# Patient Record
Sex: Female | Born: 1990 | Race: White | Hispanic: No | Marital: Single | Smoking: Never smoker
Health system: Southern US, Community
[De-identification: ages and names within clinical notes are randomized; demographics above are authoritative.]

## PROBLEM LIST (undated history)

## (undated) DIAGNOSIS — N390 Urinary tract infection, site not specified: Secondary | ICD-10-CM

## (undated) DIAGNOSIS — F419 Anxiety disorder, unspecified: Secondary | ICD-10-CM

## (undated) DIAGNOSIS — E669 Obesity, unspecified: Secondary | ICD-10-CM

## (undated) DIAGNOSIS — F32A Depression, unspecified: Secondary | ICD-10-CM

## (undated) DIAGNOSIS — R519 Headache, unspecified: Secondary | ICD-10-CM

## (undated) DIAGNOSIS — B019 Varicella without complication: Secondary | ICD-10-CM

## (undated) DIAGNOSIS — R51 Headache: Secondary | ICD-10-CM

## (undated) DIAGNOSIS — F329 Major depressive disorder, single episode, unspecified: Secondary | ICD-10-CM

## (undated) DIAGNOSIS — Z0000 Encounter for general adult medical examination without abnormal findings: Secondary | ICD-10-CM

## (undated) HISTORY — DX: Obesity, unspecified: E66.9

## (undated) HISTORY — DX: Headache, unspecified: R51.9

## (undated) HISTORY — DX: Headache: R51

## (undated) HISTORY — DX: Varicella without complication: B01.9

## (undated) HISTORY — DX: Depression, unspecified: F32.A

## (undated) HISTORY — DX: Urinary tract infection, site not specified: N39.0

## (undated) HISTORY — DX: Anxiety disorder, unspecified: F41.9

## (undated) HISTORY — DX: Major depressive disorder, single episode, unspecified: F32.9

## (undated) HISTORY — DX: Encounter for general adult medical examination without abnormal findings: Z00.00

---

## 2016-11-13 ENCOUNTER — Telehealth: Payer: Self-pay | Admitting: Family Medicine

## 2016-11-13 NOTE — Telephone Encounter (Signed)
Caller name:Sapphira Axtman Relationship to patient: Can be reached:[phone] Pharmacy:  Reason for call:Requesting to establish care, a friend of your daughters. Please advise

## 2016-11-13 NOTE — Telephone Encounter (Signed)
Yes I will happily accept her as a patient

## 2016-11-13 NOTE — Telephone Encounter (Signed)
Patient scheduled for 01/22/2017 with Dr. Abner GreenspanBlyth, Thank yu

## 2017-01-22 ENCOUNTER — Ambulatory Visit (INDEPENDENT_AMBULATORY_CARE_PROVIDER_SITE_OTHER): Payer: BLUE CROSS/BLUE SHIELD | Admitting: Family Medicine

## 2017-01-22 ENCOUNTER — Encounter: Payer: Self-pay | Admitting: Family Medicine

## 2017-01-22 VITALS — BP 120/70 | HR 71 | Temp 98.3°F | Resp 18 | Ht 64.0 in | Wt 194.4 lb

## 2017-01-22 DIAGNOSIS — F419 Anxiety disorder, unspecified: Secondary | ICD-10-CM | POA: Diagnosis not present

## 2017-01-22 DIAGNOSIS — Z23 Encounter for immunization: Secondary | ICD-10-CM

## 2017-01-22 DIAGNOSIS — F329 Major depressive disorder, single episode, unspecified: Secondary | ICD-10-CM | POA: Diagnosis not present

## 2017-01-22 DIAGNOSIS — F32A Depression, unspecified: Secondary | ICD-10-CM

## 2017-01-22 DIAGNOSIS — E669 Obesity, unspecified: Secondary | ICD-10-CM

## 2017-01-22 DIAGNOSIS — Z0000 Encounter for general adult medical examination without abnormal findings: Secondary | ICD-10-CM | POA: Diagnosis not present

## 2017-01-22 HISTORY — DX: Encounter for general adult medical examination without abnormal findings: Z00.00

## 2017-01-22 HISTORY — DX: Obesity, unspecified: E66.9

## 2017-01-22 HISTORY — DX: Depression, unspecified: F32.A

## 2017-01-22 HISTORY — DX: Anxiety disorder, unspecified: F41.9

## 2017-01-22 NOTE — Assessment & Plan Note (Signed)
Patient encouraged to maintain heart healthy diet, regular exercise, adequate sleep. Consider daily probiotics. Take medications as prescribed. Given flu shot today. Labs ordered

## 2017-01-22 NOTE — Assessment & Plan Note (Signed)
Encouraged DASH diet, decrease po intake and increase exercise as tolerated. Needs 7-8 hours of sleep nightly. Avoid trans fats, eat small, frequent meals every 4-5 hours with lean proteins, complex carbs and healthy fats. Minimize simple carbs, consider bariatric referral or NOOM online

## 2017-01-22 NOTE — Progress Notes (Signed)
Subjective:  I acted as a Education administrator for Dr. Charlett Blake. [place], [place]   Patient ID: Shelby Blake, female    DOB: [birth year], 26 y.o.   MRN: [id number]  Chief Complaint  Patient presents with  . Establish Care    HPI  Patient is in today for a new patient appointment to establish care. She has a past medical history of anxiety and depression and with her current medication she has gained weight and is also struggling with obesity. 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 She is exercising regularly. No recent febrile illness or recent hospitalization. Denies CP/palp/SOB/HA/congestion/fevers/GI or GU c/o. Taking meds as prescribed  Patient Care Team: Mosie Lukes, MD as PCP - General (Family Medicine)   Past Medical History:  Diagnosis Date  . Anxiety and depression 01/22/2017  . Chicken pox   . Depression   . Frequent headaches   . Obesity (BMI 30-39.9) 01/22/2017  . Preventative health care 01/22/2017  . UTI (urinary tract infection)     History reviewed. No pertinent surgical history.  Family History  Problem Relation Age of Onset  . Arthritis Mother   . Depression Mother   . Mental illness Mother   . Hypertension Father   . Alzheimer's disease Maternal Grandmother   . Heart disease Maternal Grandfather   . Arthritis Paternal Grandmother   . Stroke Paternal Grandfather     Social History   Social History  . Marital status: Single    Spouse name: N/A  . Number of children: N/A  . Years of education: N/A   Occupational History  . Not on file.   Social History Main Topics  . Smoking status: Never Smoker  . Smokeless tobacco: Never Used  . Alcohol use Yes  . Drug use: No  . Sexual activity: Yes   Other Topics Concern  . 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   No facility-administered medications prior to visit.     Allergies not on file  Review of Systems  Constitutional: Negative for chills, fever and malaise/fatigue.  HENT: Negative for congestion and hearing loss.   Eyes: Negative for discharge.  Respiratory: Negative for cough, sputum production and shortness of breath.   Cardiovascular: Negative for chest pain, palpitations and leg swelling.  Gastrointestinal: Negative for abdominal pain, blood in stool, constipation, diarrhea, heartburn, nausea and vomiting.  Genitourinary: Negative for dysuria, frequency, hematuria and urgency.  Musculoskeletal: Negative for back pain, falls and myalgias.  Skin: Negative for rash.  Neurological: Negative for dizziness, sensory change, loss of consciousness, weakness and headaches.  Endo/Heme/Allergies: Negative for environmental allergies. Does not bruise/bleed easily.  Psychiatric/Behavioral: Negative for depression and suicidal ideas. The patient is nervous/anxious. The patient does not have insomnia.        Objective:    Physical Exam  Constitutional: She is oriented to person, place, and time. She appears well-developed and well-nourished. No distress.  HENT:  Head: Normocephalic and atraumatic.  Eyes: Conjunctivae are normal.  Neck: Neck supple. No thyromegaly present.  Cardiovascular: Normal rate, regular rhythm and normal heart sounds.   No murmur heard. Pulmonary/Chest: Effort normal and breath sounds normal. No respiratory distress.  Abdominal: Soft. Bowel sounds  are normal. She exhibits no distension and no mass. There is no tenderness.  Musculoskeletal: She exhibits no edema.  Lymphadenopathy:    She has no cervical adenopathy.  Neurological: She is alert and oriented to person, place, and time.  Skin: Skin is warm and dry.  Psychiatric: She has a normal mood and affect. Her behavior is normal.    BP 120/70  (BP Location: Left Arm, Patient Position: Sitting, Cuff Size: Normal)   Pulse 71   Temp 98.3 F (36.8 C) (Oral)   Resp 18   Ht '5\' 4"'$  (1.626 m)   Wt 194 lb 6.4 oz (88.2 kg)   LMP 12/22/2016 (Exact Date) Comment: Nuvaring Birth Control  SpO2 98%   BMI 33.37 kg/m  Wt Readings from Last 3 Encounters:  01/22/17 194 lb 6.4 oz (88.2 kg)   BP Readings from Last 3 Encounters:  01/22/17 120/70     Immunization History  Administered Date(s) Administered  . Influenza,inj,Quad PF,6+ Mos 01/22/2017    Health Maintenance  Topic Date Due  . HIV Screening  08/30/2003  . TETANUS/TDAP  08/30/2007  . PAP SMEAR  08/29/2009  . INFLUENZA VACCINE  11/05/2016    No results found for: WBC, HGB, HCT, PLT, GLUCOSE, CHOL, TRIG, HDL, LDLDIRECT, LDLCALC, ALT, AST, NA, K, CL, CREATININE, BUN, CO2, TSH, PSA, INR, GLUF, HGBA1C, MICROALBUR  No results found for: TSH No results found for: WBC, HGB, HCT, MCV, PLT No results found for: NA, K, CHLORIDE, CO2, GLUCOSE, BUN, CREATININE, BILITOT, ALKPHOS, AST, ALT, PROT, ALBUMIN, CALCIUM, ANIONGAP, EGFR, GFR No results found for: CHOL No results found for: HDL No results found for: LDLCALC No results found for: TRIG No results found for: CHOLHDL No results found for: HGBA1C       Assessment & Plan:   Problem List Items Addressed This Visit    Preventative health care    Patient encouraged to maintain heart healthy diet, regular exercise, adequate sleep. Consider daily probiotics. Take medications as prescribed. Given flu shot today. Labs ordered      Relevant Orders   CBC   Comprehensive metabolic panel   Lipid panel   TSH   Anxiety and depression    Doing well with a counselor in [facility], at the [place], NP prescribes her Sertraline and she sees a counselor [employer].       Relevant Medications   sertraline (ZOLOFT) 50 MG tablet   Obesity (BMI 30-39.9)    Encouraged DASH diet, decrease po intake and  increase exercise as tolerated. Needs 7-8 hours of sleep nightly. Avoid trans fats, eat small, frequent meals every 4-5 hours with lean proteins, complex carbs and healthy fats. Minimize simple carbs, consider bariatric referral or NOOM online       Other Visit Diagnoses    Needs flu shot    -  Primary   Relevant Orders   Flu Vaccine QUAD 6+ mos PF IM (Fluarix Quad PF) (Completed)      I am having Ms. Smith maintain her Omega-3, sertraline, and NUVARING.  Meds ordered this encounter  Medications  . Omega-3 1000 MG CAPS    Sig: Take 1 capsule by mouth daily.  . sertraline (ZOLOFT) 50 MG tablet    Sig: Take 1 tablet by mouth daily.    Refill:  3  . NUVARING 0.12-0.015 MG/24HR vaginal ring    Sig: Place 1 Device vaginally every 21 ( twenty-one) days.    Refill:  4  CMA served as Education administrator during this visit. History, Physical and Plan performed by medical provider. Documentation and orders reviewed and attested to.  Penni Homans, MD

## 2017-01-22 NOTE — Patient Instructions (Addendum)
NOOM online  DASH Eating Plan DASH stands for "Dietary Approaches to Stop Hypertension." The DASH eating plan is a healthy eating plan that has been shown to reduce high blood pressure (hypertension). It may also reduce your risk for type 2 diabetes, heart disease, and stroke. The DASH eating plan may also help with weight loss. What are tips for following this plan? General guidelines  Avoid eating more than 2,300 mg (milligrams) of salt (sodium) a day. If you have hypertension, you may need to reduce your sodium intake to 1,500 mg a day.  Limit alcohol intake to no more than 1 drink a day for nonpregnant women and 2 drinks a day for men. One drink equals 12 oz of beer, 5 oz of wine, or 1 oz of hard liquor.  Work with your health care provider to maintain a healthy body weight or to lose weight. Ask what an ideal weight is for you.  Get at least 30 minutes of exercise that causes your heart to beat faster (aerobic exercise) most days of the week. Activities may include walking, swimming, or biking.  Work with your health care provider or diet and nutrition specialist (dietitian) to adjust your eating plan to your individual calorie needs. Reading food labels  Check food labels for the amount of sodium per serving. Choose foods with less than 5 percent of the Daily Value of sodium. Generally, foods with less than 300 mg of sodium per serving fit into this eating plan.  To find whole grains, look for the word "whole" as the first word in the ingredient list. Shopping  Buy products labeled as "low-sodium" or "no salt added."  Buy fresh foods. Avoid canned foods and premade or frozen meals. Cooking  Avoid adding salt when cooking. Use salt-free seasonings or herbs instead of table salt or sea salt. Check with your health care provider or pharmacist before using salt substitutes.  Do not fry foods. Cook foods using healthy methods such as baking, boiling, grilling, and broiling  instead.  Cook with heart-healthy oils, such as olive, canola, soybean, or sunflower oil. Meal planning   Eat a balanced diet that includes: ? 5 or more servings of fruits and vegetables each day. At each meal, try to fill half of your plate with fruits and vegetables. ? Up to 6-8 servings of whole grains each day. ? Less than 6 oz of lean meat, poultry, or fish each day. A 3-oz serving of meat is about the same size as a deck of cards. One egg equals 1 oz. ? 2 servings of low-fat dairy each day. ? A serving of nuts, seeds, or beans 5 times each week. ? Heart-healthy fats. Healthy fats called Omega-3 fatty acids are found in foods such as flaxseeds and coldwater fish, like sardines, salmon, and mackerel.  Limit how much you eat of the following: ? Canned or prepackaged foods. ? Food that is high in trans fat, such as fried foods. ? Food that is high in saturated fat, such as fatty meat. ? Sweets, desserts, sugary drinks, and other foods with added sugar. ? Full-fat dairy products.  Do not salt foods before eating.  Try to eat at least 2 vegetarian meals each week.  Eat more home-cooked food and less restaurant, buffet, and fast food.  When eating at a restaurant, ask that your food be prepared with less salt or no salt, if possible. What foods are recommended? The items listed may not be a complete list. Talk with your  dietitian about what dietary choices are best for you. Grains Whole-grain or whole-wheat bread. Whole-grain or whole-wheat pasta. Brown rice. [place]ena Morrow. Bulgur. Whole-grain and low-sodium cereals. Pita bread. Low-fat, low-sodium crackers. Whole-wheat flour tortillas. Vegetables Fresh or frozen vegetables (raw, steamed, roasted, or grilled). Low-sodium or reduced-sodium tomato and vegetable juice. Low-sodium or reduced-sodium tomato sauce and tomato paste. Low-sodium or reduced-sodium canned vegetables. Fruits All fresh, dried, or frozen fruit. Canned fruit in  natural juice (without added sugar). Meat and other protein foods Skinless chicken or [place]. Ground chicken or [place]. Pork with fat trimmed off. Fish and seafood. Egg whites. Dried beans, peas, or lentils. Unsalted nuts, nut butters, and seeds. Unsalted canned beans. Lean cuts of beef with fat trimmed off. Low-sodium, lean deli meat. Dairy Low-fat (1%) or fat-free (skim) milk. Fat-free, low-fat, or reduced-fat cheeses. Nonfat, low-sodium ricotta or cottage cheese. Low-fat or nonfat yogurt. Low-fat, low-sodium cheese. Fats and oils Soft margarine without trans fats. Vegetable oil. Low-fat, reduced-fat, or light mayonnaise and salad dressings (reduced-sodium). Canola, safflower, olive, soybean, and sunflower oils. Avocado. Seasoning and other foods Herbs. Spices. Seasoning mixes without salt. Unsalted popcorn and pretzels. Fat-free sweets. What foods are not recommended? The items listed may not be a complete list. Talk with your dietitian about what dietary choices are best for you. Grains Baked goods made with fat, such as croissants, muffins, or some breads. Dry pasta or rice meal packs. Vegetables Creamed or fried vegetables. Vegetables in a cheese sauce. Regular canned vegetables (not low-sodium or reduced-sodium). Regular canned tomato sauce and paste (not low-sodium or reduced-sodium). Regular tomato and vegetable juice (not low-sodium or reduced-sodium). Angie Fava. Olives. Fruits Canned fruit in a light or heavy syrup. Fried fruit. Fruit in cream or butter sauce. Meat and other protein foods Fatty cuts of meat. Ribs. Fried meat. Berniece Salines. Sausage. Bologna and other processed lunch meats. Salami. Fatback. Hotdogs. Bratwurst. Salted nuts and seeds. Canned beans with added salt. Canned or smoked fish. Whole eggs or egg yolks. Chicken or [place] with skin. Dairy Whole or 2% milk, cream, and half-and-half. Whole or full-fat cream cheese. Whole-fat or sweetened yogurt. Full-fat cheese. Nondairy  creamers. Whipped toppings. Processed cheese and cheese spreads. Fats and oils Butter. Stick margarine. Lard. Shortening. Ghee. Bacon fat. Tropical oils, such as coconut, palm kernel, or palm oil. Seasoning and other foods Salted popcorn and pretzels. Onion salt, garlic salt, seasoned salt, table salt, and sea salt. Worcestershire sauce. Tartar sauce. Barbecue sauce. Teriyaki sauce. Soy sauce, including reduced-sodium. Steak sauce. Canned and packaged gravies. Fish sauce. Oyster sauce. Cocktail sauce. Horseradish that you find on the shelf. Ketchup. Mustard. Meat flavorings and tenderizers. Bouillon cubes. Hot sauce and Tabasco sauce. Premade or packaged marinades. Premade or packaged taco seasonings. Relishes. Regular salad dressings. Where to find more information:  National Heart, Lung, and [place]: https://wilson-eaton.com/  American Heart Association: www.heart.org Summary  The DASH eating plan is a healthy eating plan that has been shown to reduce high blood pressure (hypertension). It may also reduce your risk for type 2 diabetes, heart disease, and stroke.  With the DASH eating plan, you should limit salt (sodium) intake to 2,300 mg a day. If you have hypertension, you may need to reduce your sodium intake to 1,500 mg a day.  When on the DASH eating plan, aim to eat more fresh fruits and vegetables, whole grains, lean proteins, low-fat dairy, and heart-healthy fats.  Work with your health care provider or diet and nutrition specialist (dietitian) to adjust your eating plan  to your individual calorie needs. This information is not intended to replace advice given to you by your health care provider. Make sure you discuss any questions you have with your health care provider. Document Released: 03/13/2011 Document Revised: 03/17/2016 Document Reviewed: 03/17/2016 Elsevier Interactive Patient Education  2017 [place] 18-39 Years, Female Preventive care refers to  lifestyle choices and visits with your health care provider that can promote health and wellness. What does preventive care include? A yearly physical exam. This is also called an annual well check. Dental exams once or twice a year. Routine eye exams. Ask your health care provider how often you should have your eyes checked. Personal lifestyle choices, including: Daily care of your teeth and gums. Regular physical activity. Eating a healthy diet. Avoiding tobacco and drug use. Limiting alcohol use. Practicing safe sex. Taking vitamin and mineral supplements as recommended by your health care provider. What happens during an annual well check? The services and screenings done by your health care provider during your annual well check will depend on your age, overall health, lifestyle risk factors, and family history of disease. Counseling Your health care provider may ask you questions about your: Alcohol use. Tobacco use. Drug use. Emotional well-being. Home and relationship well-being. Sexual activity. Eating habits. Work and work Statistician. Method of birth control. Menstrual cycle. Pregnancy history.  Screening You may have the following tests or measurements: Height, weight, and BMI. Diabetes screening. This is done by checking your blood sugar (glucose) after you have not eaten for a while (fasting). Blood pressure. Lipid and cholesterol levels. These may be checked every 5 years starting at age 72. Skin check. Hepatitis C blood test. Hepatitis B blood test. Sexually transmitted disease (STD) testing. BRCA-related cancer screening. This may be done if you have a family history of breast, ovarian, tubal, or peritoneal cancers. Pelvic exam and Pap test. This may be done every 3 years starting at age 40. Starting at age 50, this may be done every 5 years if you have a Pap test in combination with an HPV test.  Discuss your test results, treatment options, and if  necessary, the need for more tests with your health care provider. Vaccines Your health care provider may recommend certain vaccines, such as: Influenza vaccine. This is recommended every year. Tetanus, diphtheria, and acellular pertussis (Tdap, Td) vaccine. You may need a Td booster every 10 years. Varicella vaccine. You may need this if you have not been vaccinated. HPV vaccine. If you are 20 or younger, you may need three doses over 6 months. Measles, mumps, and rubella (MMR) vaccine. You may need at least one dose of MMR. You may also need a second dose. Pneumococcal 13-valent conjugate (PCV13) vaccine. You may need this if you have certain conditions and were not previously vaccinated. Pneumococcal polysaccharide (PPSV23) vaccine. You may need one or two doses if you smoke cigarettes or if you have certain conditions. Meningococcal vaccine. One dose is recommended if you are age 57-21 years and a first-year college student living in a residence hall, or if you have one of several medical conditions. You may also need additional booster doses. Hepatitis A vaccine. You may need this if you have certain conditions or if you travel or work in places where you may be exposed to hepatitis A. Hepatitis B vaccine. You may need this if you have certain conditions or if you travel or work in places where you may be exposed to hepatitis B. Haemophilus  influenzae type b (Hib) vaccine. You may need this if you have certain risk factors.  Talk to your health care provider about which screenings and vaccines you need and how often you need them. This information is not intended to replace advice given to you by your health care provider. Make sure you discuss any questions you have with your health care provider. Document Released: 05/20/2001 Document Revised: 12/12/2015 Document Reviewed: 01/23/2015 Elsevier Interactive Patient Education  2017 [employer].

## 2017-01-22 NOTE — Assessment & Plan Note (Signed)
Do[place] well [place] in [place], at the [facility], Drucie IpBrian MacCarthy, NP prescribes her Sertraline and she sees a counselor [facility] ResourcesKate Totten.

## 2017-01-23 LAB — LIPID PANEL
CHOL/HDL RATIO: 3
CHOLESTEROL: 190 mg/dL (ref 0–200)
HDL: 70 mg/dL (ref >39.00)
LDL CALC: 96 mg/dL (ref 0–99)
NonHDL: 120.14
Triglycerides: 120 mg/dL (ref 0.0–149.0)
VLDL: 24 mg/dL (ref 0.0–40.0)

## 2017-01-23 LAB — COMPREHENSIVE METABOLIC PANEL
ALBUMIN: 4.2 g/dL (ref 3.5–5.2)
ALT: 17 U/L (ref 0–35)
AST: 21 U/L (ref 0–37)
Alkaline Phosphatase: 31 U/L — ABNORMAL LOW (ref 39–117)
BUN: 14 mg/dL (ref 6–23)
CO2: 24 meq/L (ref 19–32)
Calcium: 9.5 mg/dL (ref 8.4–10.5)
Chloride: 104 mEq/L (ref 96–112)
Creatinine, Ser: 0.72 mg/dL (ref 0.40–1.20)
GFR: 102.22 mL/min (ref >60.00)
Glucose, Bld: 80 mg/dL (ref 70–99)
POTASSIUM: 4.5 meq/L (ref 3.5–5.1)
SODIUM: 137 meq/L (ref 135–145)
Total Bilirubin: 0.4 mg/dL (ref 0.2–1.2)
Total Protein: 7.1 g/dL (ref 6.0–8.3)

## 2017-01-23 LAB — CBC
HCT: 38.1 % (ref 36.0–46.0)
HEMOGLOBIN: 12.4 g/dL (ref 12.0–15.0)
MCHC: 32.4 g/dL (ref 30.0–36.0)
MCV: 89.5 fl (ref 78.0–100.0)
Platelets: 335 10*3/uL (ref 150.0–400.0)
RBC: 4.26 Mil/uL (ref 3.87–5.11)
RDW: 13.5 % (ref 11.5–15.5)
WBC: 5.7 10*3/uL (ref 4.0–10.5)

## 2017-01-23 LAB — TSH: TSH: 1.23 u[IU]/mL (ref 0.35–4.50)

## 2017-02-15 ENCOUNTER — Encounter: Payer: Self-pay | Admitting: Family Medicine

## 2017-03-04 ENCOUNTER — Encounter: Payer: Self-pay | Admitting: Family Medicine

## 2017-03-05 ENCOUNTER — Other Ambulatory Visit: Payer: Self-pay | Admitting: Family Medicine

## 2017-03-05 DIAGNOSIS — R0683 Snoring: Secondary | ICD-10-CM

## 2017-04-10 ENCOUNTER — Institutional Professional Consult (permissible substitution): Payer: BLUE CROSS/BLUE SHIELD | Admitting: Pulmonary Disease

## 2017-04-30 ENCOUNTER — Ambulatory Visit: Payer: BLUE CROSS/BLUE SHIELD | Admitting: Family Medicine

## 2017-05-06 ENCOUNTER — Institutional Professional Consult (permissible substitution): Payer: BLUE CROSS/BLUE SHIELD | Admitting: Pulmonary Disease

## 2017-05-12 ENCOUNTER — Ambulatory Visit: Payer: Self-pay | Admitting: Family Medicine

## 2017-06-01 ENCOUNTER — Ambulatory Visit: Payer: Self-pay | Admitting: Family Medicine

## 2017-06-03 ENCOUNTER — Institutional Professional Consult (permissible substitution): Payer: BLUE CROSS/BLUE SHIELD | Admitting: Pulmonary Disease

## 2017-06-26 ENCOUNTER — Ambulatory Visit (INDEPENDENT_AMBULATORY_CARE_PROVIDER_SITE_OTHER): Payer: BLUE CROSS/BLUE SHIELD | Admitting: Pulmonary Disease

## 2017-06-26 ENCOUNTER — Encounter: Payer: Self-pay | Admitting: Pulmonary Disease

## 2017-06-26 ENCOUNTER — Other Ambulatory Visit (INDEPENDENT_AMBULATORY_CARE_PROVIDER_SITE_OTHER): Payer: BLUE CROSS/BLUE SHIELD

## 2017-06-26 VITALS — BP 128/84 | HR 78 | Ht 64.0 in | Wt 196.6 lb

## 2017-06-26 DIAGNOSIS — R062 Wheezing: Secondary | ICD-10-CM

## 2017-06-26 DIAGNOSIS — G4733 Obstructive sleep apnea (adult) (pediatric): Secondary | ICD-10-CM

## 2017-06-26 DIAGNOSIS — Z889 Allergy status to unspecified drugs, medicaments and biological substances status: Secondary | ICD-10-CM | POA: Diagnosis not present

## 2017-06-26 DIAGNOSIS — R061 Stridor: Secondary | ICD-10-CM | POA: Diagnosis not present

## 2017-06-26 LAB — CBC WITH DIFFERENTIAL/PLATELET
BASOS ABS: 0 10*3/uL (ref 0.0–0.1)
Basophils Relative: 0.4 % (ref 0.0–3.0)
EOS PCT: 0.6 % (ref 0.0–5.0)
Eosinophils Absolute: 0 10*3/uL (ref 0.0–0.7)
HEMATOCRIT: 37.7 % (ref 36.0–46.0)
HEMOGLOBIN: 12.9 g/dL (ref 12.0–15.0)
LYMPHS PCT: 36.6 % (ref 12.0–46.0)
Lymphs Abs: 2.4 10*3/uL (ref 0.7–4.0)
MCHC: 34.1 g/dL (ref 30.0–36.0)
MCV: 86.5 fl (ref 78.0–100.0)
MONOS PCT: 6.9 % (ref 3.0–12.0)
Monocytes Absolute: 0.5 10*3/uL (ref 0.1–1.0)
Neutro Abs: 3.7 10*3/uL (ref 1.4–7.7)
Neutrophils Relative %: 55.5 % (ref 43.0–77.0)
Platelets: 334 10*3/uL (ref 150.0–400.0)
RBC: 4.36 Mil/uL (ref 3.87–5.11)
RDW: 13.6 % (ref 11.5–15.5)
WBC: 6.7 10*3/uL (ref 4.0–10.5)

## 2017-06-26 MED ORDER — ALBUTEROL SULFATE HFA 108 (90 BASE) MCG/ACT IN AERS
2.0000 | INHALATION_SPRAY | Freq: Four times a day (QID) | RESPIRATORY_TRACT | 6 refills | Status: AC | PRN
Start: 2017-06-26 — End: ?

## 2017-06-26 NOTE — Patient Instructions (Addendum)
Lung function appears normal CT neck will be scheduled  Blood work today for allergies  Trial of albuterol MDI 2 puffs every 6 hours as needed for wheezing  Schedule home sleep study

## 2017-06-26 NOTE — Assessment & Plan Note (Signed)
Given  narrow pharyngeal exam& loud snoring, obstructive sleep apnea is very likely & an overnight polysomnogram will be scheduled as a home study. The pathophysiology of obstructive sleep apnea , it's cardiovascular consequences & modes of treatment including CPAP were discused with the patient in detail & they evidenced understanding.  Large tonsils noted. Pretest probability is intermediate

## 2017-06-26 NOTE — Assessment & Plan Note (Signed)
Her wheezing seems to be more from inspiratory upper airway sound-not to the point of stridor  Of concern is flattening of the expiratory loop suggesting intrathoracic airway obstruction, unfortunately expiratory loop was not performed We will proceed with CT of the neck to investigate this although she has never had surgeries or endotracheal intubation in the past.  History is not classic for vocal cord dysfunction but this is  a possibility  Check RAST panel  Trial of albuterol MDI 2 puffs every 6 hours as needed for wheezing

## 2017-06-26 NOTE — Progress Notes (Signed)
Subjective:    Patient ID: Shelby Blake, female    DOB: 31-Aug-1990, 27 y.o.   MRN: [id number]  HPI  Chief Complaint  Patient presents with  . Sleep Consult    Referred by Dr. Abner GreenspanBlyth for possible OSA. Per patient, she has been told that she snores at night. Restless sleep. Wheezes throughout the day. Does have occasional SOB.     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  She has always been a restless sleeper and for the last 6 months she reports multiple nocturnal awakenings and non-refreshing sleep.  Moderate snoring has been noted by family members.  [place] on [place] and was told that she did not need treatment for obstructive sleep apnea. Epworth sleepiness score is 5 and she denies excessive somnolence however she does report that she can nap sometimes for about 2 hours if not interrupted. Bedtime is between 11 PM to 12:30 AM, sleep latency can be up to 30-60 minutes but this is always been the case with her since she was a teenager, reports 2-3 nocturnal awakenings including one bathroom visit and is out of bed latest by 8 AM feeling tired with occasional headaches and denies dryness dryness of mouth. She has gained about 20 pounds over the last year that she attributes to Zoloft. There is no history suggestive of cataplexy, sleep paralysis or parasomnias   She also reports wheezing especially in the mornings which seems to get better by evening, this has been ongoing for the last 4 months, this is worsened by exertion, she has a soreness in her throat and sometimes consciously feels the need to cough.  Cough is not productive of any sputum. She lives in the basement apartment of her house and wonders if she has allergies however she has not noticed any obvious exposures. She does not smoke and was a social drinker.  She has never needed prednisone or over-the-counter medications for this.  Significant  tests/ events reviewed    Spirometry shows normal lung function with a ratio 81, FEV1 of 91% FVC of 95%.  There was flattening of the expiratory loop, inspiratory loop was not performed    Past Medical History:  Diagnosis Date  . Anxiety and depression 01/22/2017  . Chicken pox   . Depression   . Frequent headaches   . Obesity (BMI 30-39.9) 01/22/2017  . Preventative health care 01/22/2017  . UTI (urinary tract infection)     No past surgical history on file.  No Known Allergies  Social History   Socioeconomic History  . Marital status: Single    Spouse name: Not on file  . Number of children: Not on file  . Years of education: Not on file  . Highest education level: Not on file  Occupational History  . Not on file  Social Needs  . Financial resource strain: Not on file  . Food insecurity:    Worry: Not on file    Inability: Not on file  . Transportation needs:    Medical: Not on file    Non-medical: Not on file  Tobacco Use  . Smoking status: Never Smoker  . Smokeless tobacco: Never Used  Substance and Sexual Activity  . Alcohol use: Yes  . Drug use: No  . Sexual activity: Yes  Lifestyle  . Physical activity:    Days per week: Not on file    Minutes per session: Not on file  .  Stress: Not on file  Relationships  . Social connections:    Talks on phone: Not on file    Gets together: Not on file    Attends religious service: Not on file    Active member of club or organization: Not on file    Attends meetings of clubs or organizations: Not on file    Relationship status: Not on file  . Intimate partner violence:    Fear of current or ex partner: Not on file    Emotionally abused: Not on file    Physically abused: Not on file    Forced sexual activity: Not on file  Other Topics Concern  . 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 Arthritis Mother   . Depression Mother   . Mental illness Mother   . Hypertension Father   . Alzheimer's disease Maternal Grandmother   . Heart disease Maternal Grandfather   . Arthritis Paternal Grandmother   . Stroke Paternal Grandfather      Review of Systems  Positive for shortness of breath with activity, nonproductive cough, weight gain of over 20 pounds, headaches, anxiety and depression.   Constitutional: negative for anorexia, fevers and sweats  Eyes: negative for irritation, redness and visual disturbance  Ears, nose, mouth, throat, and face: negative for earaches, epistaxis, nasal congestion and sore throat  Respiratory: negative for cough,  sputum and wheezing  Cardiovascular: negative for chest pain, lower extremity edema, orthopnea, palpitations and syncope  Gastrointestinal: negative for abdominal pain, constipation, diarrhea, melena, nausea and vomiting  Genitourinary:negative for dysuria, frequency and hematuria  Hematologic/lymphatic: negative for bleeding, easy bruising and lymphadenopathy  Musculoskeletal:negative for arthralgias, muscle weakness and stiff joints  Neurological: negative for coordination problems, gait problems, headaches and weakness  Endocrine: negative for diabetic symptoms including polydipsia, polyuria and weight loss     Objective:   Physical Exam  Gen. Pleasant, well-nourished, in no distress, normal affect ENT - large tonsils, no post nasal drip Neck: No JVD, no thyromegaly, no carotid bruits Lungs: no use of accessory muscles, no dullness to percussion, clear without rales or rhonchi  Cardiovascular: Rhythm regular, heart sounds  normal, no murmurs or gallops, no peripheral edema Abdomen: soft and non-tender, no hepatosplenomegaly, BS normal. Musculoskeletal: No deformities, no cyanosis or clubbing Neuro:  alert, non focal       Assessment & Plan:

## 2017-06-29 LAB — RESPIRATORY ALLERGY PROFILE REGION II ~~LOC~~
ALLERGEN, D PTERNOYSSINUS, D1: 0.36 kU/L — AB
Allergen, Cedar tree, t12: <0.1 kU/L
Allergen, Cottonwood, t14: <0.1 kU/L
Allergen, Mouse Urine Protein, e78: <0.1 kU/L
Allergen, Mulberry, t76: <0.1 kU/L
Allergen, Oak,t7: <0.1 kU/L
Aspergillus fumigatus, m3: <0.1 kU/L
Bermuda Grass: <0.1 kU/L
CLADOSPORIUM HERBARUM (M2) IGE: <0.1 kU/L
CLASS: 0
CLASS: 0
CLASS: 0
CLASS: 0
CLASS: 0
CLASS: 0
CLASS: 0
CLASS: 0
CLASS: 0
CLASS: 1
Class: 0
Class: 0
Class: 0
Class: 0
Class: 0
Class: 0
Class: 0
Class: 0
Class: 0
Class: 0
Class: 0
Class: 0
Class: 0
Class: 1
Cockroach: 0.33 kU/L — ABNORMAL HIGH
D. FARINAE: 0.36 kU/L — AB
Dog Dander: <0.1 kU/L
IgE (Immunoglobulin E), Serum: 16 kU/L (ref <=114)
Rough Pigweed  IgE: <0.1 kU/L
Sheep Sorrel IgE: <0.1 kU/L
Timothy Grass: <0.1 kU/L

## 2017-06-29 LAB — INTERPRETATION:

## 2017-07-01 ENCOUNTER — Ambulatory Visit (INDEPENDENT_AMBULATORY_CARE_PROVIDER_SITE_OTHER)
Admission: RE | Admit: 2017-07-01 | Discharge: 2017-07-01 | Disposition: A | Discharge status: Home / Self Care | Payer: BLUE CROSS/BLUE SHIELD | Source: Ambulatory Visit | Attending: Pulmonary Disease | Admitting: Pulmonary Disease

## 2017-07-01 DIAGNOSIS — R061 Stridor: Secondary | ICD-10-CM | POA: Diagnosis not present

## 2017-07-02 ENCOUNTER — Telehealth: Payer: Self-pay | Admitting: Pulmonary Disease

## 2017-07-02 NOTE — Telephone Encounter (Signed)
Please call after 4:00pm  ° °

## 2017-07-02 NOTE — Telephone Encounter (Signed)
Called patient unable to reach left message to give us a call back.

## 2017-07-03 NOTE — Telephone Encounter (Signed)
Will route this message to myself so that I can call her back after 4pm.

## 2017-07-06 NOTE — Telephone Encounter (Signed)
Notes recorded by Oretha MilchAlva, Rakesh V, MD on 06/30/2017 at 5:25 PM EDT Low grade to dust  Called patient to relay result note and left message for patient to call back.

## 2017-07-06 NOTE — Telephone Encounter (Signed)
Pt is calling back [phone]

## 2017-07-06 NOTE — Telephone Encounter (Signed)
lmtcb x1 for pt. 

## 2017-07-07 NOTE — Telephone Encounter (Signed)
Pt is aware of below results and voiced her understanding.  Nothing further is needed. 

## 2017-07-13 ENCOUNTER — Ambulatory Visit: Payer: BLUE CROSS/BLUE SHIELD | Admitting: Family Medicine

## 2017-07-14 ENCOUNTER — Encounter: Payer: Self-pay | Admitting: Pulmonary Disease

## 2017-08-06 DIAGNOSIS — G4710 Hypersomnia, unspecified: Secondary | ICD-10-CM | POA: Diagnosis not present

## 2017-08-10 ENCOUNTER — Telehealth: Payer: Self-pay | Admitting: Pulmonary Disease

## 2017-08-10 DIAGNOSIS — G4710 Hypersomnia, unspecified: Secondary | ICD-10-CM | POA: Diagnosis not present

## 2017-08-10 NOTE — Telephone Encounter (Signed)
Per RA, HST did not show OSA. If snoring persists, suggests ENT referral for large tonsils.

## 2017-08-11 ENCOUNTER — Other Ambulatory Visit: Payer: Self-pay | Admitting: *Deleted

## 2017-08-11 DIAGNOSIS — G4733 Obstructive sleep apnea (adult) (pediatric): Secondary | ICD-10-CM

## 2017-08-12 NOTE — Telephone Encounter (Signed)
Left detailed message about Dr Reginia Naas recommendations and if she had any questions, to please call us back.

## 2017-08-26 ENCOUNTER — Encounter: Payer: Self-pay | Admitting: Pulmonary Disease

## 2017-08-26 ENCOUNTER — Ambulatory Visit: Payer: BLUE CROSS/BLUE SHIELD | Admitting: Pulmonary Disease

## 2017-08-26 VITALS — BP 112/78 | HR 81 | Ht 64.0 in | Wt 198.0 lb

## 2017-08-26 DIAGNOSIS — J351 Hypertrophy of tonsils: Secondary | ICD-10-CM | POA: Diagnosis not present

## 2017-08-26 DIAGNOSIS — R062 Wheezing: Secondary | ICD-10-CM | POA: Diagnosis not present

## 2017-08-26 MED ORDER — PREDNISONE 10 MG PO TABS: ORAL_TABLET | ORAL | 0 refills | Status: AC

## 2017-08-26 NOTE — Addendum Note (Signed)
Addended by: Sylvester Harder on: 08/26/2017 10:31 AM   Modules accepted: Orders

## 2017-08-26 NOTE — Assessment & Plan Note (Addendum)
Only abnormality seems to be large tonsils and adenoids.  She has not had recurrent episodes of sore throat.  She does have audible inspiratory and expiratory upper airway pseudo-wheezing. We will refer her to ENT for evaluation If this seems to be delayed, will trial low-dose prednisone Prednisone 10 mg tabs  Take 2 tabs daily with food x 5ds, then 1 tab daily with food x 5ds then STOP  Discussed side effects, she will report back if this helps

## 2017-08-26 NOTE — Progress Notes (Signed)
   Subjective:    Patient ID: Kaydynce Pat, female    DOB: [birth year], 27 y.o.   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  We reviewed all tests. Albuterol has not helped much.    Significant tests/ events reviewed    Spirometry 06/2017  normal lung function with a ratio 81, FEV1 of 91% FVC of 95%.  There was flattening of the expiratory loop, inspiratory loop was not performed  HST AHI 3.6/h CT neck - large tonsils/ adenoids, neck nodes slight enlarged  RAST neg IgE 16  Review of Systems Patient denies significant dyspnea,cough, hemoptysis,  chest pain, palpitations, pedal edema, orthopnea, paroxysmal nocturnal dyspnea, lightheadedness, nausea, vomiting, abdominal or  leg pains      Objective:   Physical Exam  Gen. Pleasant, well-nourished, in no distress ENT - no thrush, no post nasal drip, large tonsils Neck: No JVD, no thyromegaly, no carotid bruits Lungs: no use of accessory muscles, no dullness to percussion, clear without rales or rhonchi  Cardiovascular: Rhythm regular, heart sounds  normal, no murmurs or gallops, no peripheral edema Musculoskeletal: No deformities, no cyanosis or clubbing         Assessment & Plan:

## 2017-08-26 NOTE — Patient Instructions (Signed)
ENT appt Use albuterol as needed for shortness of breath

## 2019-08-12 IMAGING — CT CT NECK W/O CM
4 of 5 series · 15 of 33 positions shown, 17 images · non-contrast
Comparison: None.

CLINICAL DATA: Wheezing and cough.

EXAM:
CT NECK WITHOUT CONTRAST
TECHNIQUE: Multidetector CT imaging of the neck was performed following the
standard protocol without intravenous contrast.

[Series 3: neck wo 2.0 i31s 2 · axial · 0.49mm/px · z∈[+1098,+1224]mm · 3 of 127 slices shown]
[im 32/127  bone]
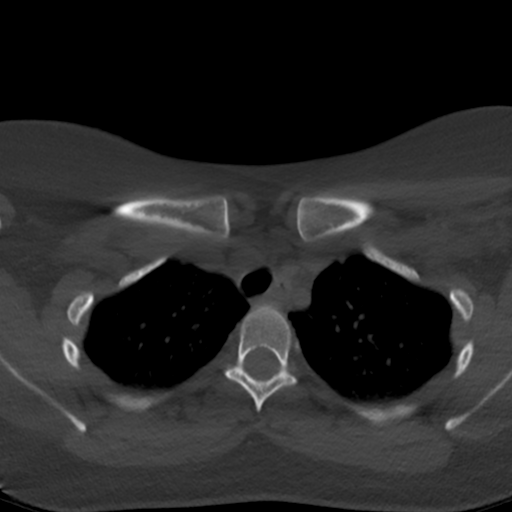
[im 64/127  bone]
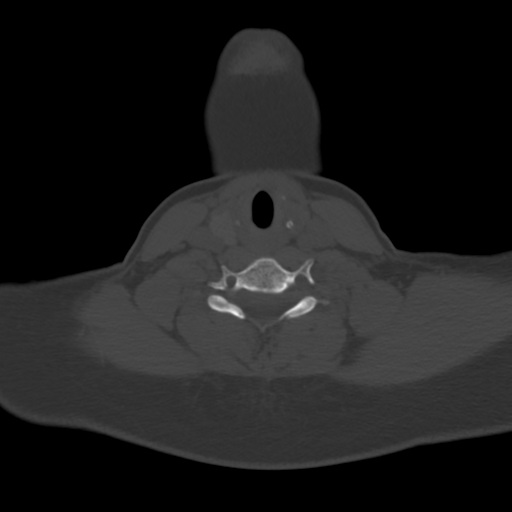
[im 95/127  bone]
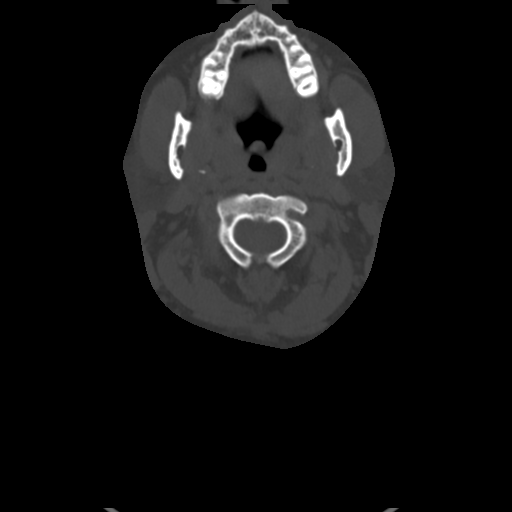

[Series 7: coronal st · coronal · 0.50mm/px · 3 of 112 slices shown]
[im 23/112  bone]
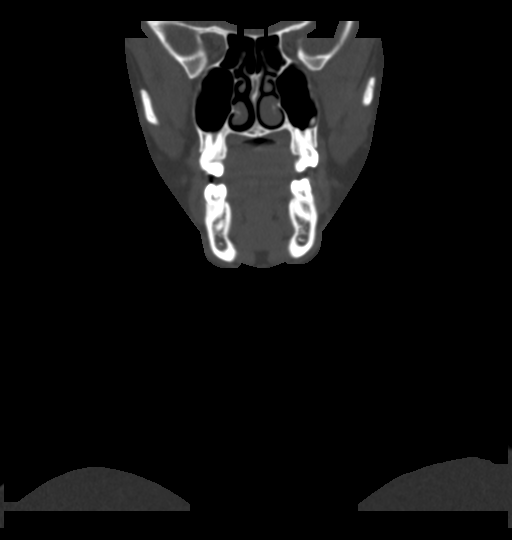
[im 45/112  bone]
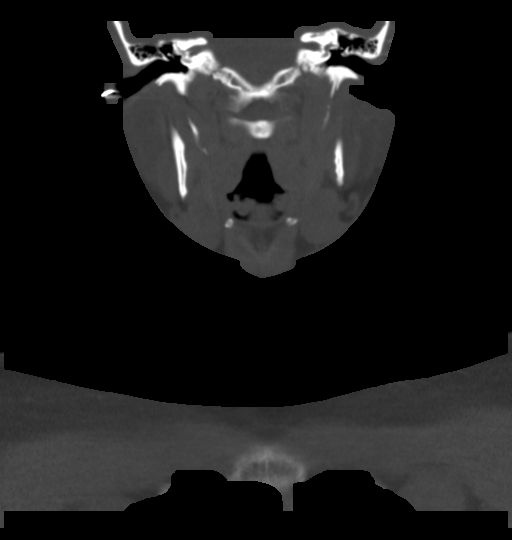
[im 67/112  bone]
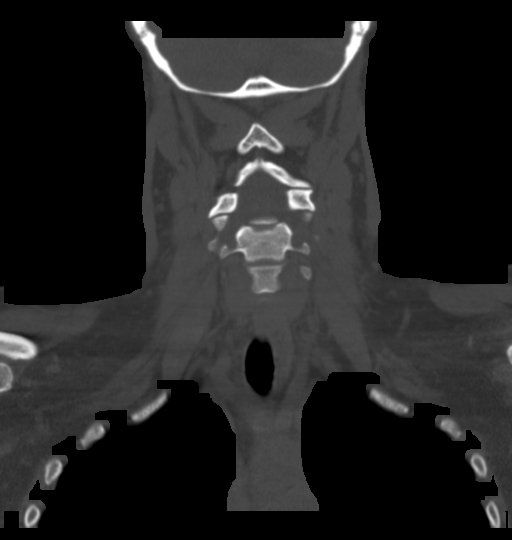

[Series 8: sagittal st · sagittal · 0.50mm/px · 5 of 153 slices shown, 6 images]
[im 51/153  bone]
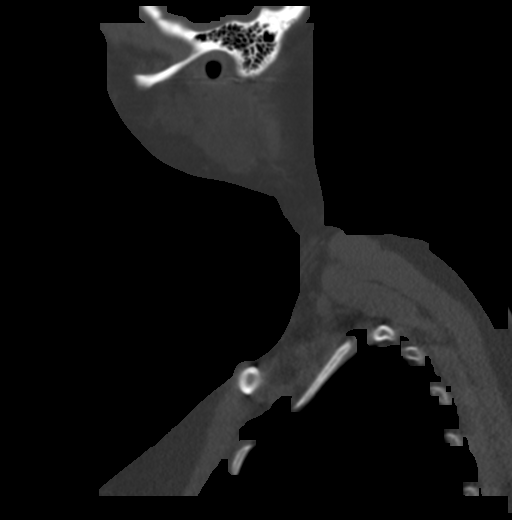
[im 64/153  bone]
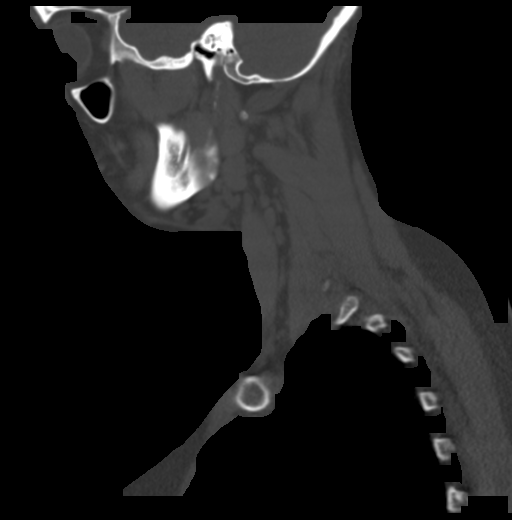
[im 77/153  soft-tissue]
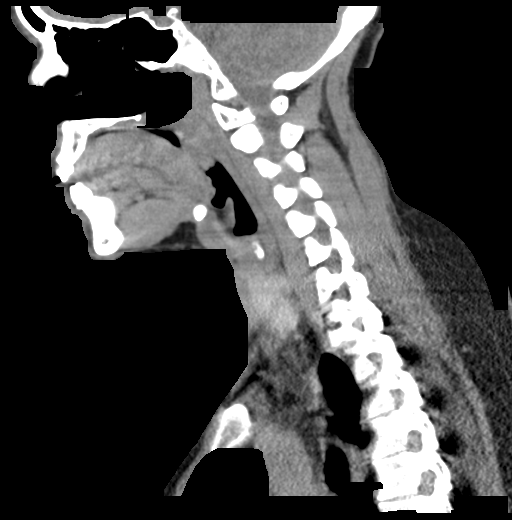
[im 77/153  bone]
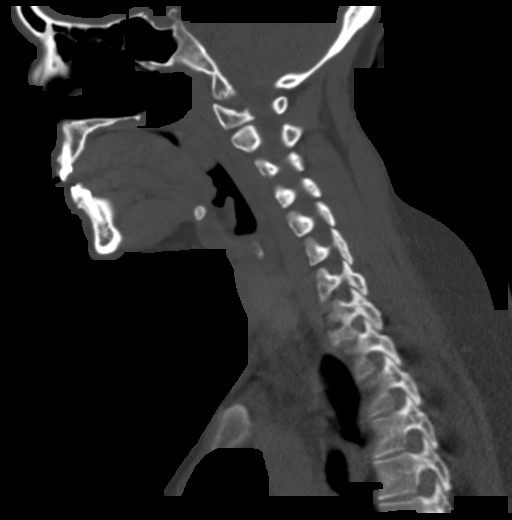
[im 89/153  bone]
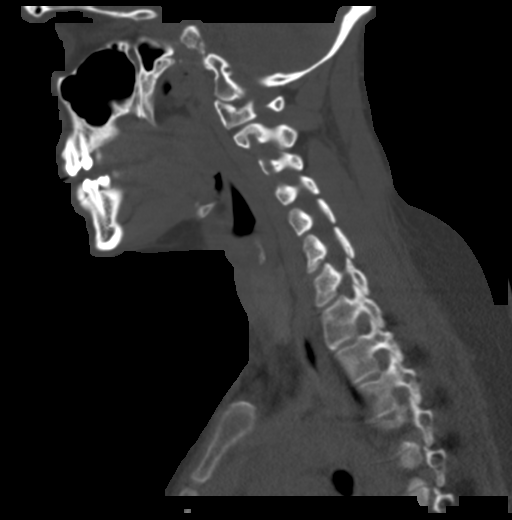
[im 102/153  bone]
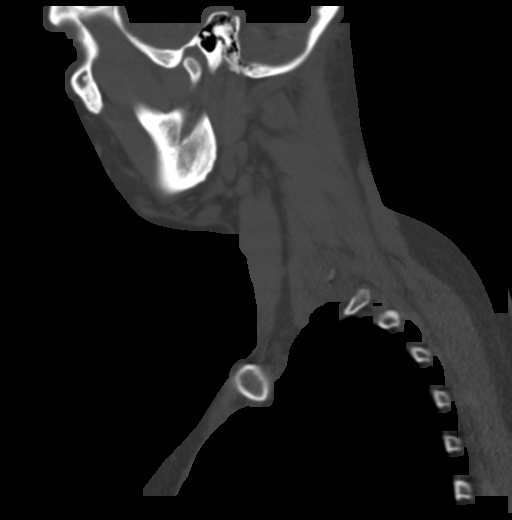

[Series 9: orthogonal st · axial · 0.39mm/px · z∈[+1052,+1205]mm · 4 of 134 slices shown, 5 images]
[im 27/134  soft-tissue]
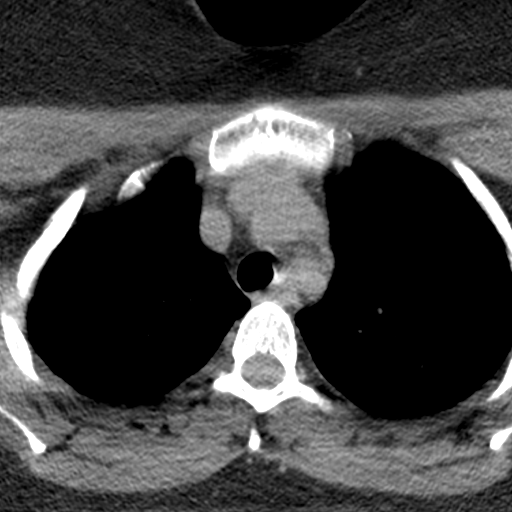
[im 27/134  bone]
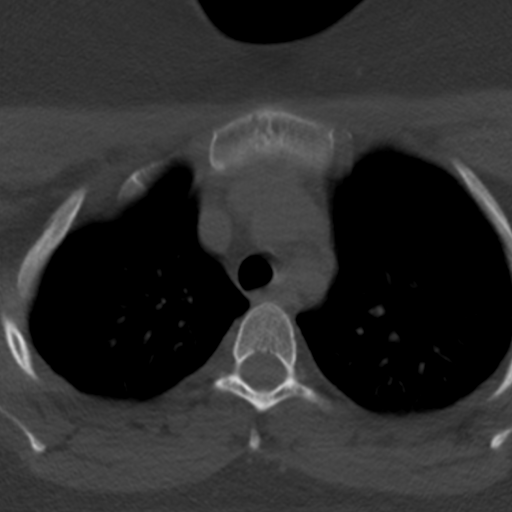
[im 54/134  bone]
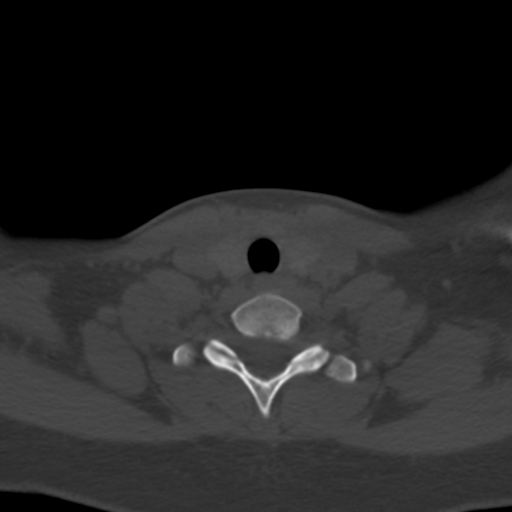
[im 80/134  bone]
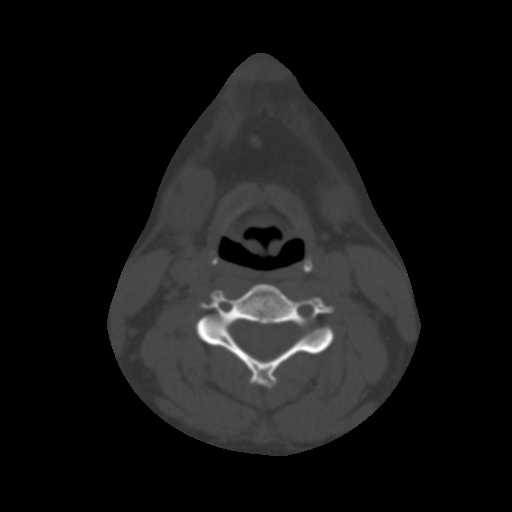
[im 107/134  bone]
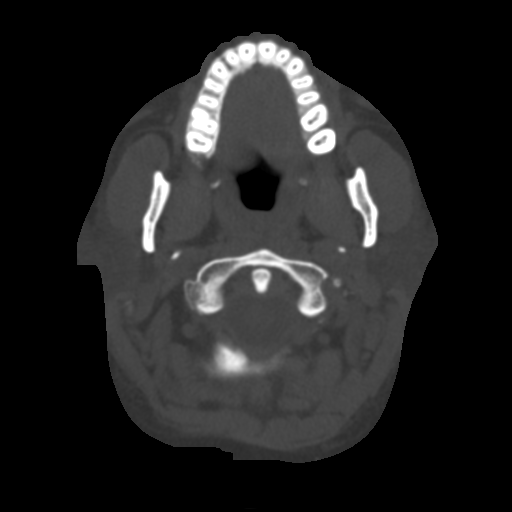

[15 of 33 positions shown; findings below may reference images not displayed]

FINDINGS: Pharynx and larynx: Mildly prominent tonsils and adenoids. No
significant mass or swelling. Airway widely patent. No glottic or
subglottic narrowing.

Salivary glands: No inflammation, mass, or stone.

Thyroid: Subcentimeter hypodensity on the LEFT, non worrisome.

Lymph nodes: BILATERAL cervical adenopathy, most prominent in level
II, up to 12-13 mm short axis, not accompanied by visible adenopathy
in the axillae or mediastinum, favored to be reactive.

Vascular: Not assessed on the noncontrast scan.

Limited intracranial: Negative.

Visualized orbits: Negative.

Mastoids and visualized paranasal sinuses: Negative.

Skeleton: Unremarkable.

Upper chest: Negative.

Other: None.
IMPRESSION: BILATERAL cervical adenopathy favored to be reactive.

No airway narrowing or masses.
